# Patient Record
Sex: Female | Born: 1970 | Race: Black or African American | Hispanic: No | Marital: Single | Smoking: Never smoker
Health system: Southern US, Community
[De-identification: ages and names within clinical notes are randomized; demographics above are authoritative.]

## PROBLEM LIST (undated history)

## (undated) DIAGNOSIS — I10 Essential (primary) hypertension: Secondary | ICD-10-CM

---

## 2013-01-22 ENCOUNTER — Ambulatory Visit: Payer: Self-pay | Admitting: Neurology

## 2013-02-20 ENCOUNTER — Ambulatory Visit: Payer: Self-pay | Admitting: Neurology

## 2018-01-30 ENCOUNTER — Ambulatory Visit
Admission: RE | Admit: 2018-01-30 | Discharge: 2018-01-30 | Disposition: A | Discharge status: Home / Self Care | Payer: Managed Care, Other (non HMO) | Source: Ambulatory Visit | Attending: Nurse Practitioner | Admitting: Nurse Practitioner

## 2018-01-30 ENCOUNTER — Other Ambulatory Visit: Payer: Self-pay | Admitting: Nurse Practitioner

## 2018-01-30 DIAGNOSIS — R52 Pain, unspecified: Secondary | ICD-10-CM

## 2018-01-30 DIAGNOSIS — M25522 Pain in left elbow: Secondary | ICD-10-CM | POA: Insufficient documentation

## 2018-02-06 ENCOUNTER — Encounter: Payer: Self-pay | Admitting: Emergency Medicine

## 2018-02-06 ENCOUNTER — Other Ambulatory Visit: Payer: Self-pay

## 2018-02-06 ENCOUNTER — Emergency Department: Payer: Managed Care, Other (non HMO)

## 2018-02-06 DIAGNOSIS — I10 Essential (primary) hypertension: Secondary | ICD-10-CM | POA: Insufficient documentation

## 2018-02-06 DIAGNOSIS — K5900 Constipation, unspecified: Secondary | ICD-10-CM | POA: Insufficient documentation

## 2018-02-06 LAB — POCT PREGNANCY, URINE: Preg Test, Ur: NEGATIVE

## 2018-02-06 NOTE — ED Triage Notes (Signed)
Patient ambulatory to triage with steady gait, without difficulty or distress noted; pt reports constipation x 4 days unrelieved by dulcolax; st hx of same and denies any accomp symptoms

## 2018-02-07 ENCOUNTER — Emergency Department
Admission: EM | Admit: 2018-02-07 | Discharge: 2018-02-07 | Disposition: A | Discharge status: Home / Self Care | Payer: Managed Care, Other (non HMO) | Attending: Emergency Medicine | Admitting: Emergency Medicine

## 2018-02-07 DIAGNOSIS — K5900 Constipation, unspecified: Secondary | ICD-10-CM

## 2018-02-07 HISTORY — DX: Essential (primary) hypertension: I10

## 2018-02-07 MED ORDER — POLYETHYLENE GLYCOL 3350 17 G PO PACK
17.0000 g | PACK | Freq: Every day | ORAL | Status: DC
  Administered 2018-02-07: 17 g via ORAL

## 2018-02-07 MED ORDER — SENNOSIDES-DOCUSATE SODIUM 8.6-50 MG PO TABS
2.0000 | ORAL_TABLET | Freq: Once | ORAL | Status: AC
  Administered 2018-02-07: 2 via ORAL
  Filled 2018-02-07: qty 2

## 2018-02-07 MED ORDER — FLEET ENEMA 7-19 GM/118ML RE ENEM
1.0000 | ENEMA | Freq: Once | RECTAL | Status: AC
  Administered 2018-02-07: 1 via RECTAL

## 2018-02-07 MED ORDER — POLYETHYLENE GLYCOL 3350 17 G PO PACK
PACK | ORAL | Status: AC
  Filled 2018-02-07: qty 1

## 2018-02-07 NOTE — ED Provider Notes (Signed)
[facility] Emergency Department Provider Note    First MD Initiated Contact with Patient 02/07/18 [phone]     (approximate)  I have reviewed the triage vital signs and the nursing notes.   HISTORY  Chief Complaint Constipation   HPI Bonnie Hernandez is a 47 y.o. female presents to the emergency department with a 4-day history of constipation unrelieved with Dulcolax at home.  Patient does admit to a history of the same. Denies any abdominal pain vomiting fever chills or any other complaints   Past Medical History:  Diagnosis Date  . Hypertension     There are no active problems to display for this patient.   History reviewed. No pertinent surgical history.  Prior to Admission medications   Not on File    Allergies No Known Drug Allergies No family history on file.  Social History Social History   Tobacco Use  . Smoking status: Never Smoker  . Smokeless tobacco: Never Used  Substance Use Topics  . Alcohol use: Not on file  . Drug use: Not on file    Review of Systems Constitutional: No fever/chills Eyes: No visual changes. ENT: No sore throat. Cardiovascular: Denies chest pain. Respiratory: Denies shortness of breath. Gastrointestinal: No abdominal pain.  No nausea, no vomiting.  No diarrhea.  Positive for constipation. Genitourinary: Negative for dysuria. Musculoskeletal: Negative for neck pain.  Negative for back pain. Integumentary: Negative for rash. Neurological: Negative for headaches, focal weakness or numbness.   ____________________________________________   PHYSICAL EXAM:  VITAL SIGNS: ED Triage Vitals  Enc Vitals Group     BP --      Pulse Rate 02/06/18 2255 92     Resp 02/06/18 2255 18     Temp 02/06/18 2255 99 F (37.2 C)     Temp Source 02/06/18 2255 Oral     SpO2 02/06/18 2255 98 %     Weight 02/06/18 2256 127 kg (280 lb)     Height 02/06/18 2256 1.702 m (5\' 7" )     Head Circumference --      Peak Flow  --      Pain Score 02/06/18 2255 0     Pain Loc --      Pain Edu? --      Excl. in GC? --     Constitutional: Alert and oriented. Well appearing and in no acute distress. Eyes: Conjunctivae are normal.  Head: Atraumatic. Mouth/Throat: Mucous membranes are moist.  Neck: No stridor.   Cardiovascular: Normal rate, regular rhythm. Good peripheral circulation. Grossly normal heart sounds. Respiratory: Normal respiratory effort.  No retractions. Lungs CTAB. Gastrointestinal: Soft and nontender. No distention.  Musculoskeletal: No lower extremity tenderness nor edema. No gross deformities of extremities. Skin:  Skin is warm, dry and intact. No rash noted.   ____________________________________________   LABS (all labs ordered are listed, but only abnormal results are displayed)  Labs Reviewed  POCT PREGNANCY, URINE    RADIOLOGY I, Natrona N BROWN, personally viewed and evaluated these images (plain radiographs) as part of my medical decision making, as well as reviewing the written report by the radiologist.  ED MD interpretation:  Nonobstructive bowel gas pattern  Official radiology report(s): Dg Abdomen 1 View  Result Date: 02/06/2018 CLINICAL DATA:  Constipation for 4 days. EXAM: ABDOMEN - 1 VIEW COMPARISON:  None. FINDINGS: Gas and stool throughout the colon. No small or large bowel distention. No radiopaque stones. Calcified pelvic phleboliths. Visualized bones appear intact. IMPRESSION: Nonobstructive bowel gas  pattern with stool-filled colon. Electronically Signed   By: Burman Nieves M.D.   On: 02/06/2018 23:35     Procedures   ____________________________________________   INITIAL IMPRESSION / ASSESSMENT AND PLAN / ED COURSE  As part of my medical decision making, I reviewed the following data within the electronic MEDICAL RECORD NUMBER   47 year old female present with above-stated history and physical exam consistent with constipation.  Patient given senna plus and  a Fleet enema in the emergency department.  Spoke to patient regarding constipation management at home.   ____________________________________________  FINAL CLINICAL IMPRESSION(S) / ED DIAGNOSES  Final diagnoses:  Constipation, unspecified constipation type     MEDICATIONS GIVEN DURING THIS VISIT:  Medications - No data to display   ED Discharge Orders    None       Note:  This document was prepared using Dragon voice recognition software and may include unintentional dictation errors.    [name], MD 02/07/18 [phone]

## 2019-06-01 IMAGING — CR DG ELBOW COMPLETE 3+V*L*
1 series · 5 of 5 positions shown · non-contrast
Comparison: No prior.

CLINICAL DATA: Pain left elbow for 2 months.  No injury.

EXAM:
LEFT ELBOW - COMPLETE 3+ VIEW

[Series 1: dg elbow complete left (3+view) · 0.14mm/px · 5 of 5 slices shown]
[im 1/5]
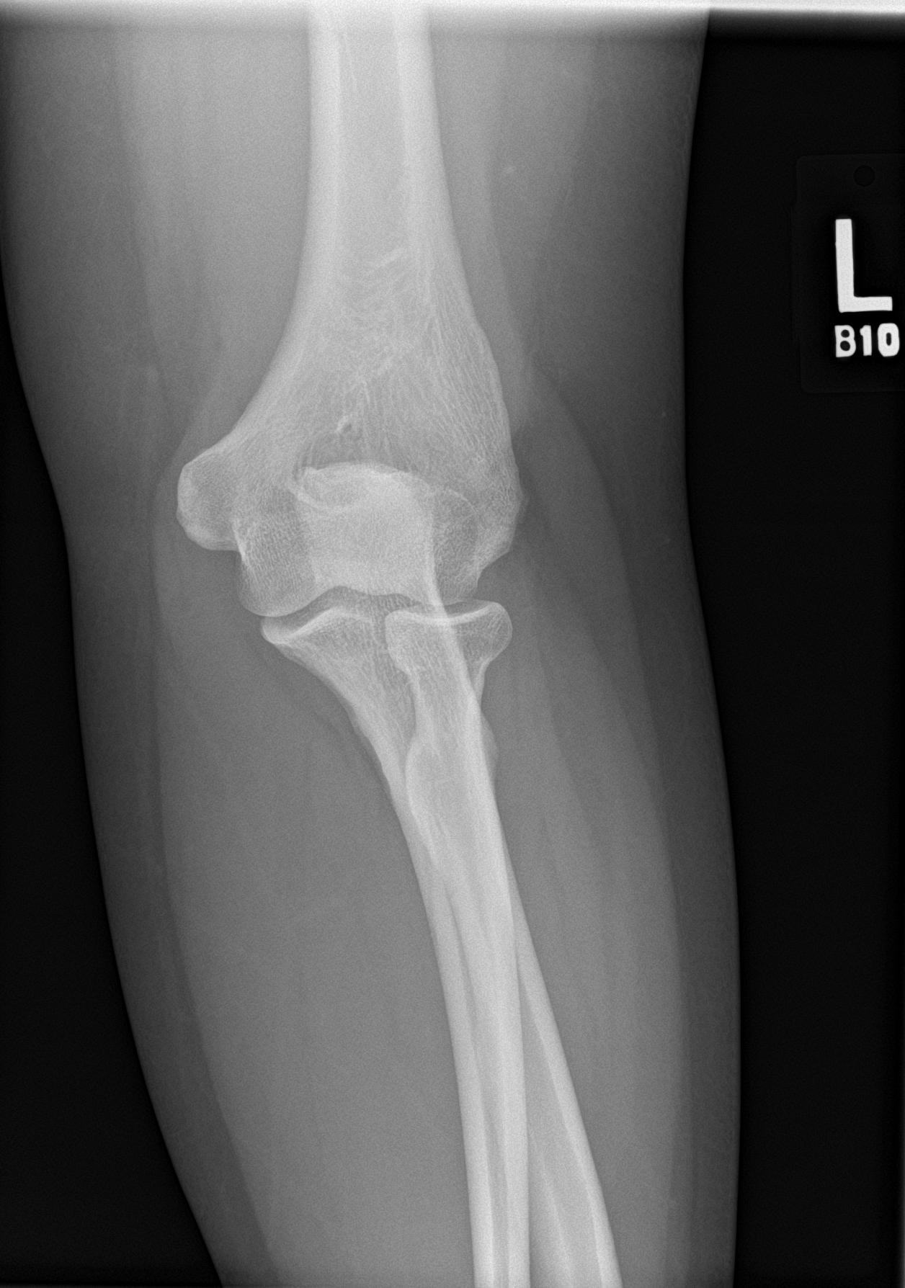
[im 2/5]
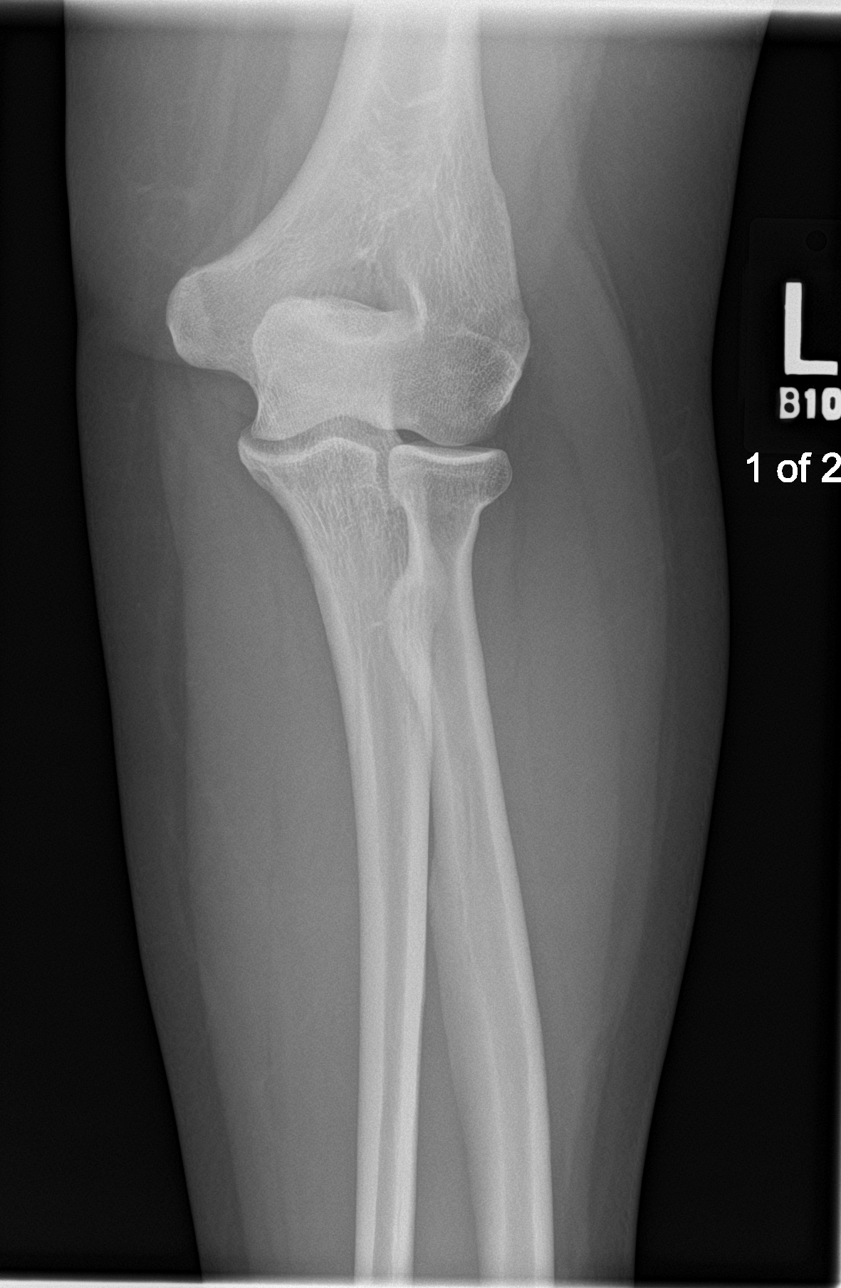
[im 3/5]
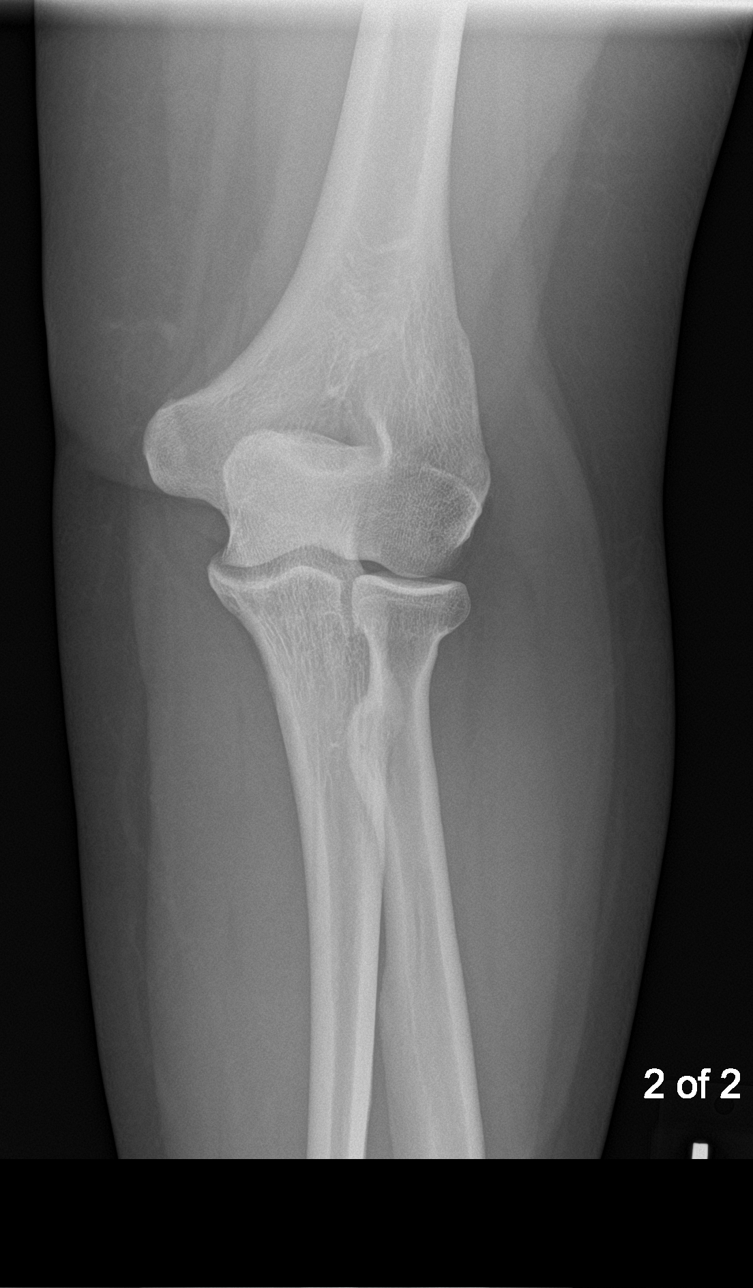
[im 4/5]
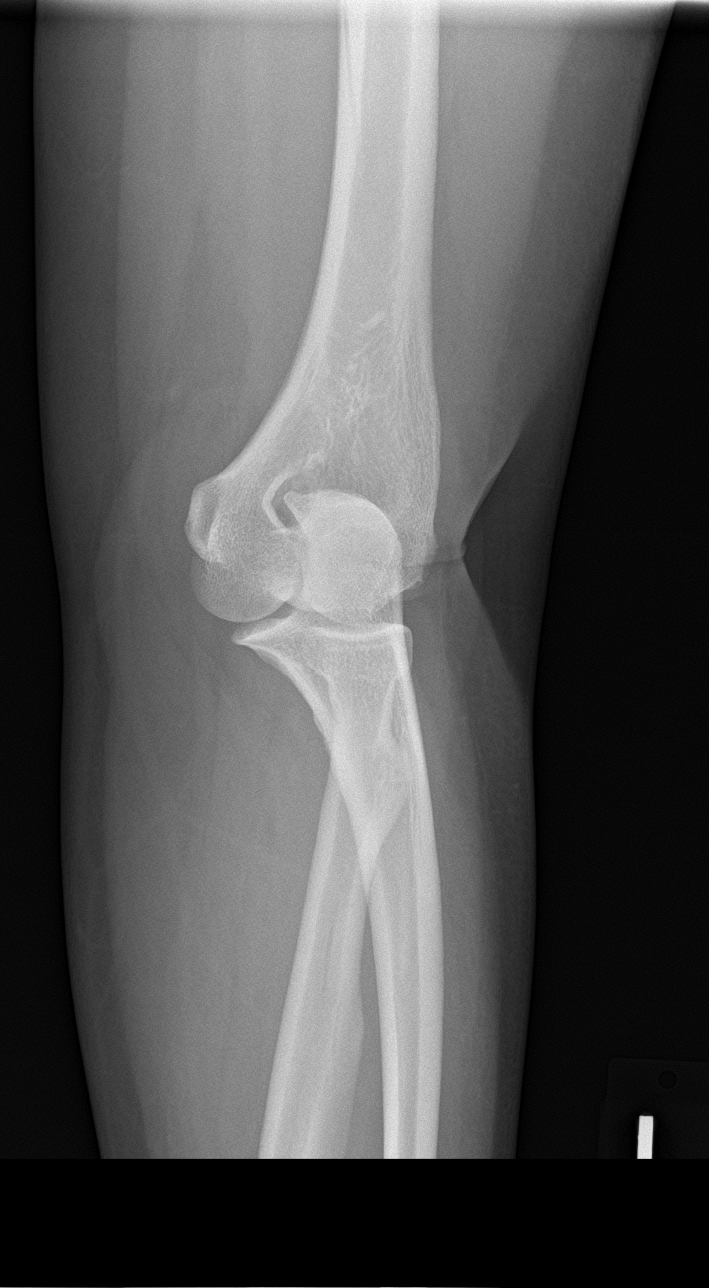
[im 5/5]
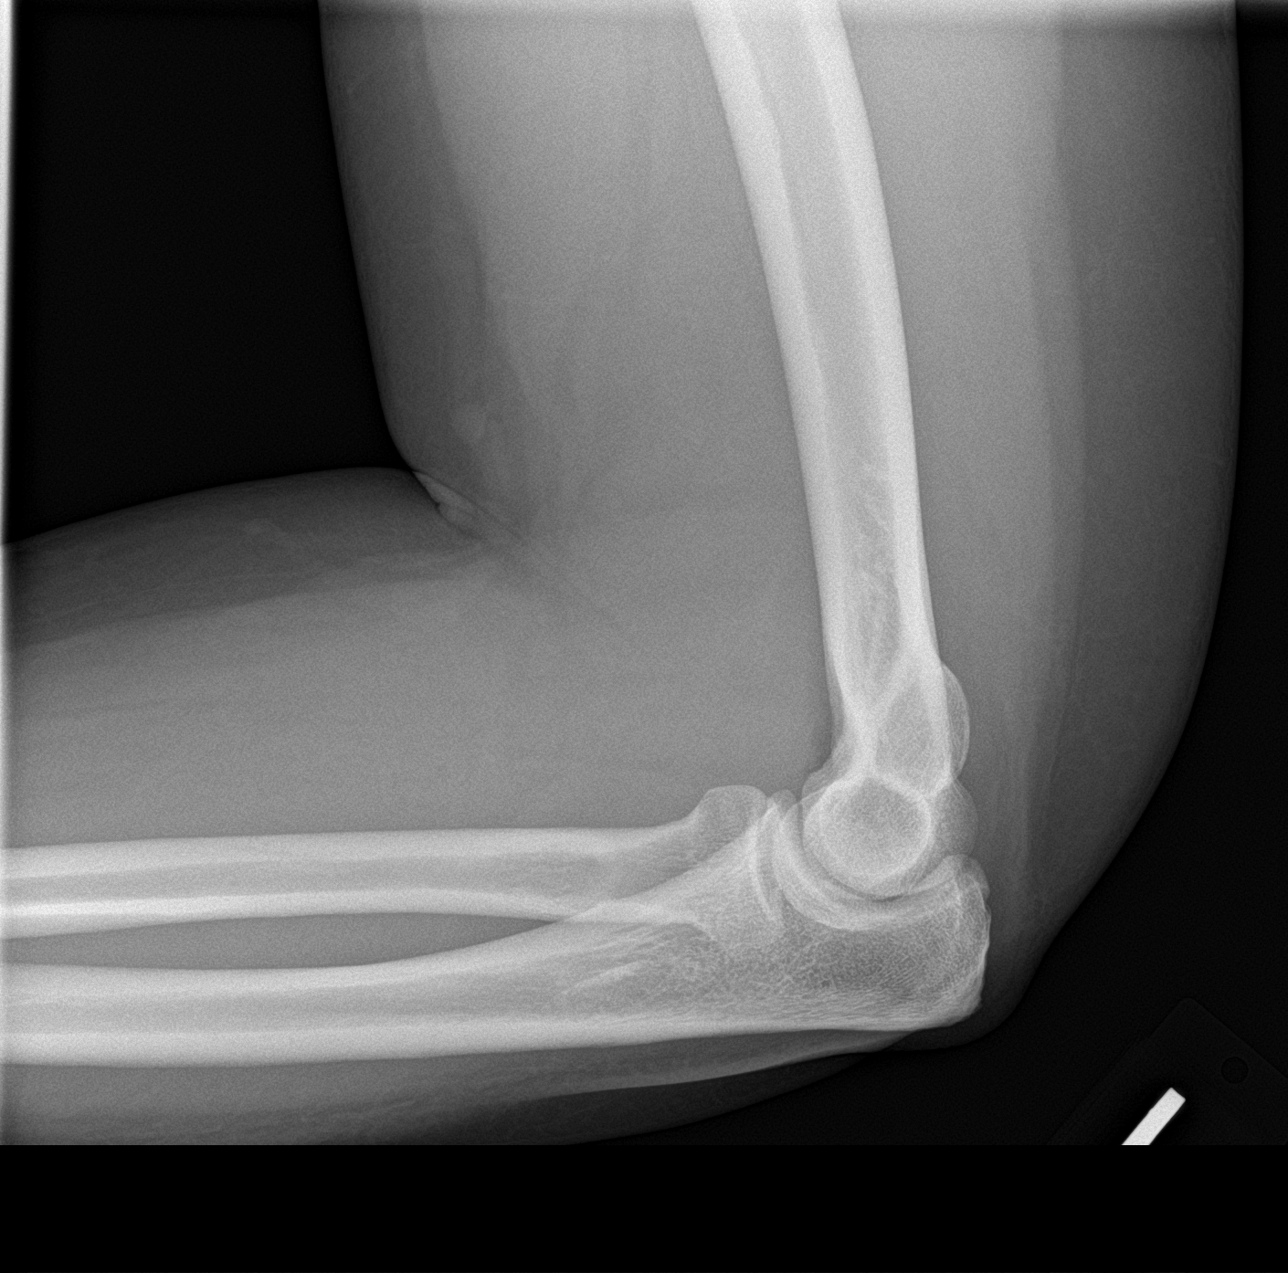

[5 of 5 positions shown; findings below may reference images not displayed]

FINDINGS: Soft tissue structures are unremarkable. Mild diffuse degenerative
change. No evidence of fracture or dislocation. No acute bony
abnormality.
IMPRESSION: Mild diffuse degenerative change, otherwise negative exam. No acute
bony abnormality.

## 2019-10-26 ENCOUNTER — Ambulatory Visit: Payer: Managed Care, Other (non HMO) | Attending: Internal Medicine

## 2019-10-26 DIAGNOSIS — Z23 Encounter for immunization: Secondary | ICD-10-CM | POA: Insufficient documentation

## 2019-10-26 NOTE — Progress Notes (Signed)
   Covid-19 Vaccination Clinic  Name:  Bonnie Hernandez    MRN: [id number] DOB: 1971-05-23  10/26/2019  [name] was observed post Covid-19 immunization for 15 minutes without incident. She was provided with Vaccine Information Sheet and instruction to access the V-Safe system.   [name] was instructed to call 911 with any severe reactions post vaccine: Marland Kitchen Difficulty breathing  . Swelling of face and throat  . A fast heartbeat  . A bad rash all over body  . Dizziness and weakness   Immunizations Administered    Name Date Dose VIS Date Route   Pfizer COVID-19 Vaccine 10/26/2019  8:58 AM 0.3 mL 08/03/2019 Intramuscular   Manufacturer: [employer], [employer]   Lot: [id number]   NDC: [address]-8

## 2019-11-16 ENCOUNTER — Ambulatory Visit: Payer: Managed Care, Other (non HMO) | Attending: Internal Medicine

## 2019-11-16 DIAGNOSIS — Z23 Encounter for immunization: Secondary | ICD-10-CM

## 2019-11-16 NOTE — Progress Notes (Signed)
   Covid-19 Vaccination Clinic  Name:  Kristine Chahal    MRN: [id number] DOB: May 17, 1971  11/16/2019  Ms. Chirino was observed post Covid-19 immunization for 15 minutes without incident. She was provided with Vaccine Information Sheet and instruction to access the V-Safe system.   Ms. Panchal was instructed to call 911 with any severe reactions post vaccine: Marland Kitchen Difficulty breathing  . Swelling of face and throat  . A fast heartbeat  . A bad rash all over body  . Dizziness and weakness   Immunizations Administered    Name Date Dose VIS Date Route   Pfizer COVID-19 Vaccine 11/16/2019 11:49 AM 0.3 mL 08/03/2019 Intramuscular   Manufacturer: [employer], [employer]   Lot: [id number]   NDC: [address]-4

## 2021-10-23 ENCOUNTER — Ambulatory Visit: Payer: Managed Care, Other (non HMO) | Admitting: Dietician

## 2021-11-23 ENCOUNTER — Ambulatory Visit: Payer: Managed Care, Other (non HMO) | Admitting: Dietician

## 2021-12-17 ENCOUNTER — Encounter: Payer: Self-pay | Admitting: Dietician

## 2021-12-17 LAB — COLOGUARD: COLOGUARD: NEGATIVE

## 2021-12-17 NOTE — Progress Notes (Signed)
Patient cancelled her second scheduled initial MNT visit on 11/24/21 (first visit was cancelled due to covid). She has not yet rescheduled; sent notification to referring provider. ?

## 2021-12-28 ENCOUNTER — Other Ambulatory Visit: Payer: Self-pay | Admitting: Family Medicine

## 2021-12-28 DIAGNOSIS — Z1231 Encounter for screening mammogram for malignant neoplasm of breast: Secondary | ICD-10-CM

## 2022-01-26 ENCOUNTER — Ambulatory Visit
Admission: RE | Admit: 2022-01-26 | Discharge: 2022-01-26 | Disposition: A | Discharge status: Home / Self Care | Payer: Managed Care, Other (non HMO) | Source: Ambulatory Visit | Attending: Family Medicine | Admitting: Family Medicine

## 2022-01-26 DIAGNOSIS — Z1231 Encounter for screening mammogram for malignant neoplasm of breast: Secondary | ICD-10-CM | POA: Diagnosis not present

## 2022-01-28 ENCOUNTER — Inpatient Hospital Stay
Admission: RE | Admit: 2022-01-28 | Discharge: 2022-01-28 | Disposition: A | Discharge status: Home / Self Care | Payer: Self-pay | Source: Ambulatory Visit | Attending: *Deleted | Admitting: *Deleted

## 2022-01-28 ENCOUNTER — Other Ambulatory Visit: Payer: Self-pay | Admitting: *Deleted

## 2022-01-28 DIAGNOSIS — Z1231 Encounter for screening mammogram for malignant neoplasm of breast: Secondary | ICD-10-CM

## 2023-02-16 ENCOUNTER — Other Ambulatory Visit: Payer: Self-pay | Admitting: Family Medicine

## 2023-02-16 DIAGNOSIS — Z1231 Encounter for screening mammogram for malignant neoplasm of breast: Secondary | ICD-10-CM

## 2023-04-14 ENCOUNTER — Ambulatory Visit
Admission: RE | Admit: 2023-04-14 | Discharge: 2023-04-14 | Disposition: A | Discharge status: Home / Self Care | Payer: Managed Care, Other (non HMO) | Source: Ambulatory Visit | Attending: Family Medicine | Admitting: Family Medicine

## 2023-04-14 DIAGNOSIS — Z1231 Encounter for screening mammogram for malignant neoplasm of breast: Secondary | ICD-10-CM | POA: Diagnosis present

## 2023-05-28 IMAGING — MG MM DIGITAL SCREENING BILAT W/ TOMO AND CAD
8 series · 8 of 24 positions shown · non-contrast
Comparison: Previous exam(s).

CLINICAL DATA: Screening.

EXAM:
DIGITAL SCREENING BILATERAL MAMMOGRAM WITH TOMOSYNTHESIS AND CAD
TECHNIQUE: Bilateral screening digital craniocaudal and mediolateral oblique
mammograms were obtained. Bilateral screening digital breast
tomosynthesis was performed. The images were evaluated with
computer-aided detection.

[R CC synth-2D]
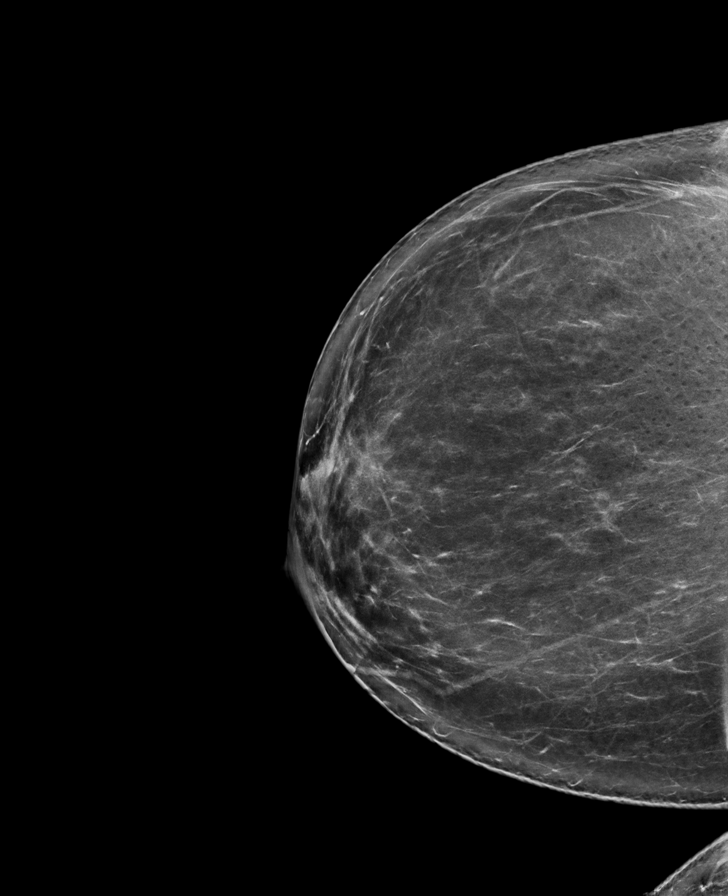

[L MLO synth-2D]
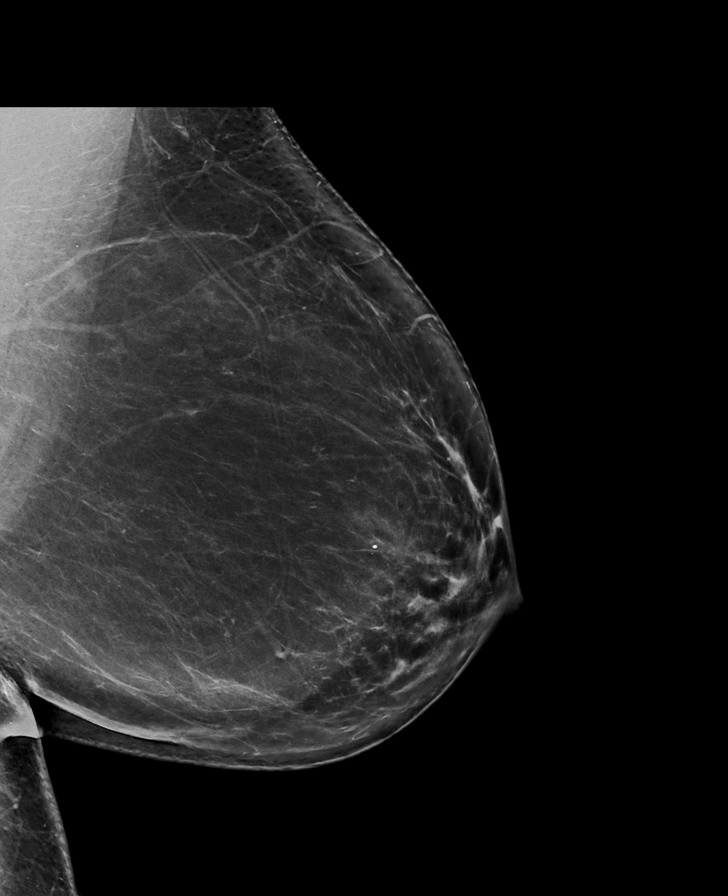

[L CC synth-2D]
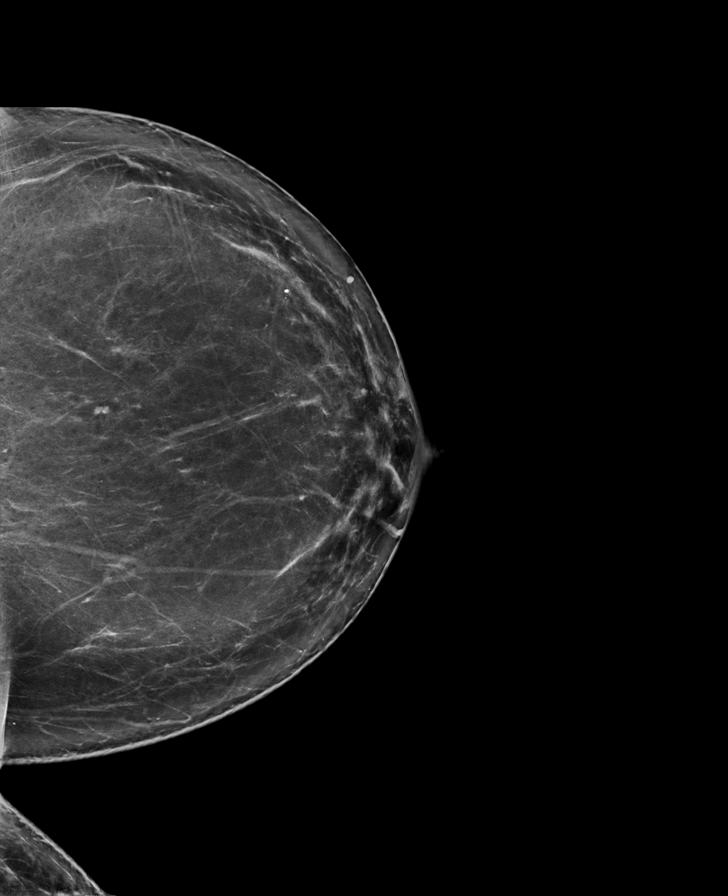

[R MLO synth-2D]
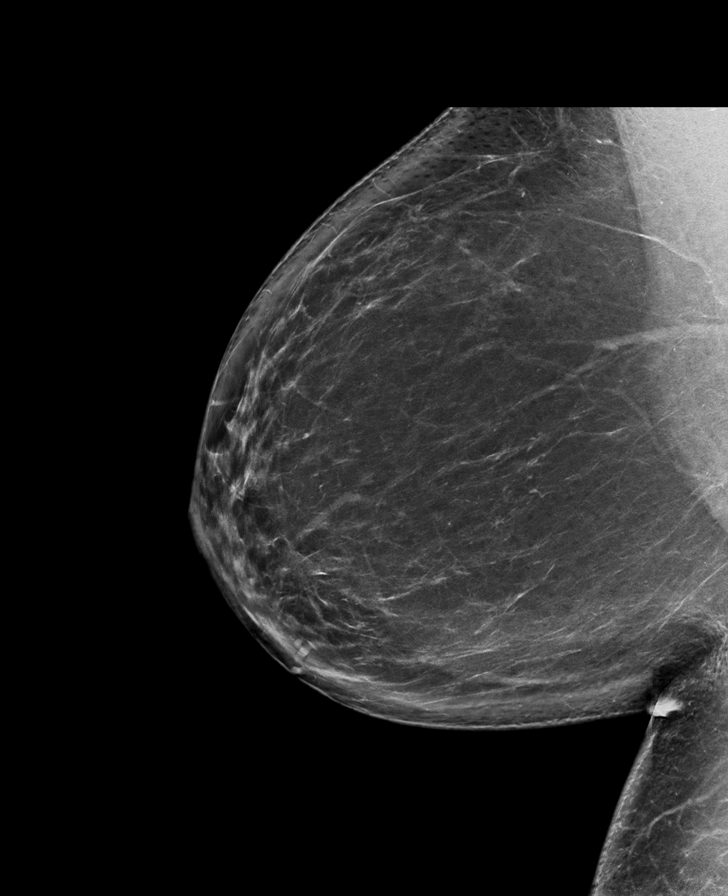

[L MLO tomo · tomo slice 49/96.0]
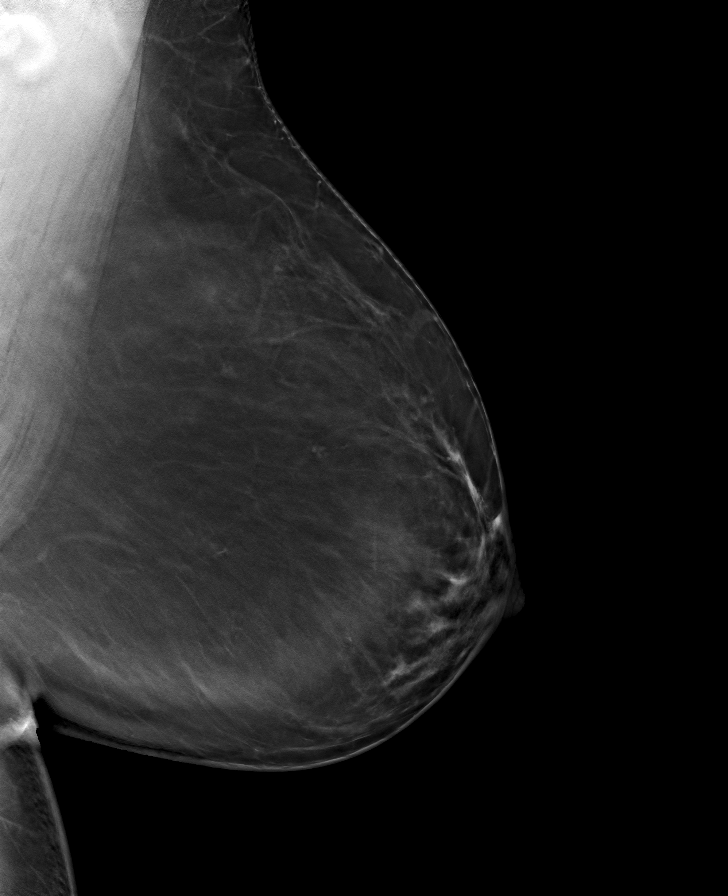

[R CC tomo · tomo slice 47/93.0]
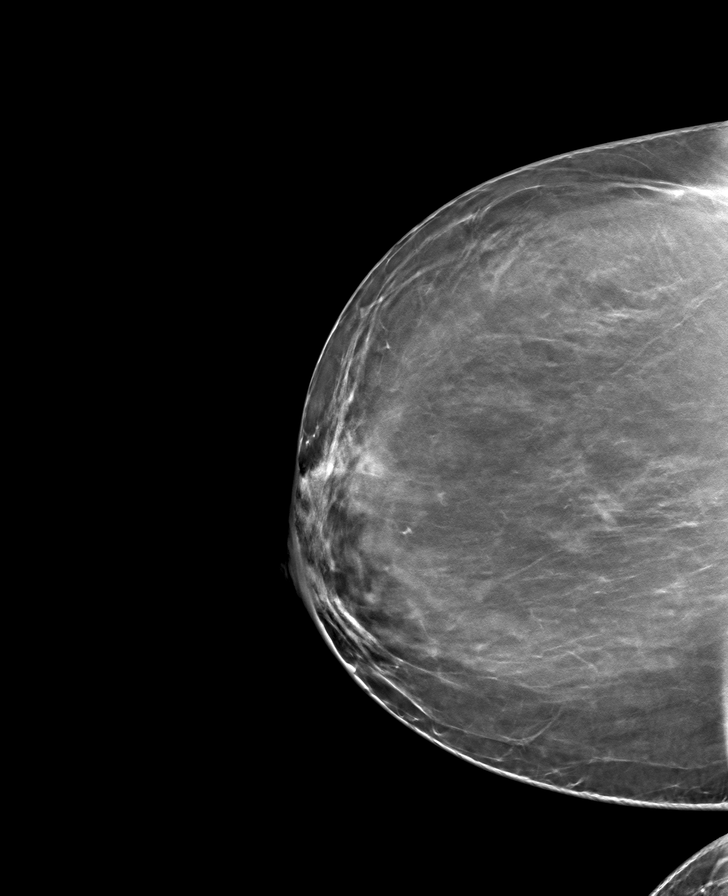

[R MLO tomo · tomo slice 51/101.0]
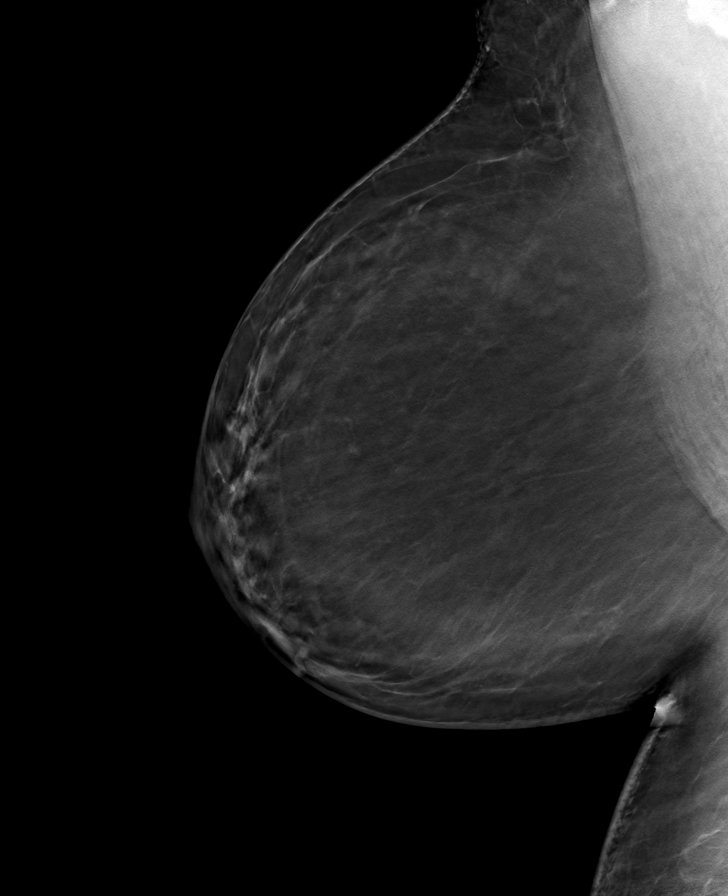

[L CC tomo · tomo slice 47/92.0]
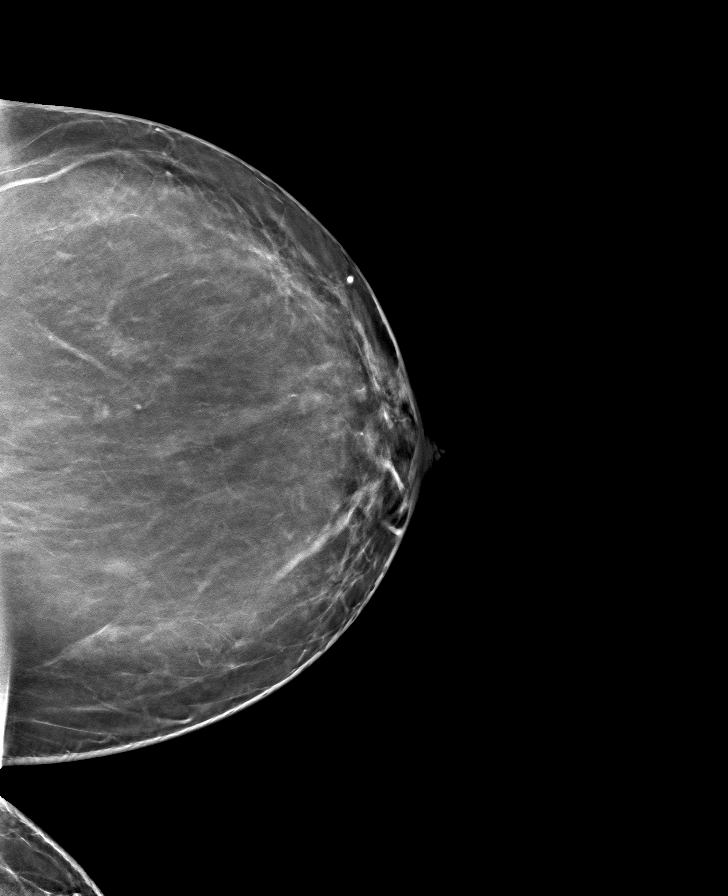

[8 of 24 positions shown; findings below may reference images not displayed]

ACR Breast Density Category b: There are scattered areas of
fibroglandular density.
FINDINGS: There are no findings suspicious for malignancy.
IMPRESSION: No mammographic evidence of malignancy. A result letter of this
screening mammogram will be mailed directly to the patient.

RECOMMENDATION:
Screening mammogram in one year. (Code:51-O-LD2)

BI-RADS CATEGORY  1: Negative.

## 2024-04-17 ENCOUNTER — Other Ambulatory Visit: Payer: Self-pay | Admitting: Family Medicine

## 2024-04-17 ENCOUNTER — Other Ambulatory Visit: Payer: Self-pay | Admitting: Obstetrics

## 2024-04-17 DIAGNOSIS — Z1231 Encounter for screening mammogram for malignant neoplasm of breast: Secondary | ICD-10-CM

## 2024-05-15 ENCOUNTER — Encounter

## 2024-08-06 ENCOUNTER — Other Ambulatory Visit: Payer: Self-pay | Admitting: Family Medicine

## 2024-08-06 DIAGNOSIS — R310 Gross hematuria: Secondary | ICD-10-CM

## 2024-08-10 ENCOUNTER — Ambulatory Visit
